# Patient Record
Sex: Male | Born: 1955 | Race: White | Hispanic: No | Marital: Married | Smoking: Former smoker
Health system: Southern US, Community
[De-identification: ages and names within clinical notes are randomized; demographics above are authoritative.]

## PROBLEM LIST (undated history)

## (undated) HISTORY — PX: OTHER SURGICAL HISTORY: SHX169

---

## 2008-03-22 ENCOUNTER — Encounter: Admission: RE | Admit: 2008-03-22 | Discharge: 2008-03-22 | Payer: Self-pay | Admitting: Neurosurgery

## 2008-04-09 ENCOUNTER — Encounter: Admission: RE | Admit: 2008-04-09 | Discharge: 2008-04-09 | Payer: Self-pay | Admitting: Neurosurgery

## 2020-06-12 ENCOUNTER — Encounter: Payer: Self-pay | Admitting: Internal Medicine

## 2020-07-12 ENCOUNTER — Other Ambulatory Visit: Payer: Self-pay

## 2020-07-12 ENCOUNTER — Ambulatory Visit (AMBULATORY_SURGERY_CENTER): Payer: Self-pay | Admitting: *Deleted

## 2020-07-12 VITALS — Ht 66.0 in | Wt 168.0 lb

## 2020-07-12 DIAGNOSIS — Z1211 Encounter for screening for malignant neoplasm of colon: Secondary | ICD-10-CM

## 2020-07-12 MED ORDER — PLENVU 140 G PO SOLR: 1.0000 | Freq: Once | ORAL | 0 refills | Status: AC

## 2020-07-12 NOTE — Progress Notes (Signed)

## 2020-07-15 ENCOUNTER — Encounter: Payer: Self-pay | Admitting: Internal Medicine

## 2020-07-16 ENCOUNTER — Other Ambulatory Visit: Payer: Self-pay

## 2020-07-16 ENCOUNTER — Encounter: Payer: Self-pay | Admitting: Internal Medicine

## 2020-07-16 ENCOUNTER — Ambulatory Visit (AMBULATORY_SURGERY_CENTER): Payer: BC Managed Care – PPO | Admitting: Internal Medicine

## 2020-07-16 VITALS — BP 116/80 | HR 99 | Temp 96.8°F | Resp 20 | Ht 66.0 in | Wt 168.0 lb

## 2020-07-16 DIAGNOSIS — D124 Benign neoplasm of descending colon: Secondary | ICD-10-CM | POA: Diagnosis not present

## 2020-07-16 DIAGNOSIS — Z1211 Encounter for screening for malignant neoplasm of colon: Secondary | ICD-10-CM

## 2020-07-16 DIAGNOSIS — D123 Benign neoplasm of transverse colon: Secondary | ICD-10-CM | POA: Diagnosis not present

## 2020-07-16 MED ORDER — SODIUM CHLORIDE 0.9 % IV SOLN: 500.0000 mL | Freq: Once | INTRAVENOUS | Status: DC

## 2020-07-16 NOTE — Progress Notes (Signed)
PT taken to PACU. Monitors in place. VSS. Report given to RN. 

## 2020-07-16 NOTE — Patient Instructions (Signed)
HANDOUTS PROVIDED ON: POLYPS, DIVERTICULOSIS, & HEMORRHOIDS  The polyps removed today have been sent for pathology.  The results can take 1-3 weeks to receive.  When your next colonoscopy should occur will be based on the pathology results.    You may resume your previous diet and medication schedule.  Thank you for allowing [place] to care for you today!!!   YOU HAD AN ENDOSCOPIC PROCEDURE TODAY AT [facility]:   Refer to the procedure report that was given to you for any specific questions about what was found during the examination.  If the procedure report does not answer your questions, please call your gastroenterologist to clarify.  If you requested that your care partner not be given the details of your procedure findings, then the procedure report has been included in a sealed envelope for you to review at your convenience later.  YOU SHOULD EXPECT: Some feelings of bloating in the abdomen. Passage of more gas than usual.  Walking can help get rid of the air that was put into your GI tract during the procedure and reduce the bloating. If you had a lower endoscopy (such as a colonoscopy or flexible sigmoidoscopy) you may notice spotting of blood in your stool or on the toilet paper. If you underwent a bowel prep for your procedure, you may not have a normal bowel movement for a few days.  Please Note:  You might notice some irritation and congestion in your nose or some drainage.  This is from the oxygen used during your procedure.  There is no need for concern and it should clear up in a day or so.  SYMPTOMS TO REPORT IMMEDIATELY:  Following lower endoscopy (colonoscopy or flexible sigmoidoscopy):  Excessive amounts of blood in the stool  Significant tenderness or worsening of abdominal pains  Swelling of the abdomen that is new, acute  Fever of 100F or higher   For urgent or emergent issues, a gastroenterologist can be reached at any hour by calling [phone]. Do  not use MyChart messaging for urgent concerns.    DIET:  We do recommend a small meal at first, but then you may proceed to your regular diet.  Drink plenty of fluids but you should avoid alcoholic beverages for 24 hours.  ACTIVITY:  You should plan to take it easy for the rest of today and you should NOT DRIVE or use heavy machinery until tomorrow (because of the sedation medicines used during the test).    FOLLOW UP: Our staff will call the number listed on your records Thursday morning between 7:15 am and 8:15 am to check on you and address any questions or concerns that you may have regarding the information given to you following your procedure. If we do not reach you, we will leave a message.  We will attempt to reach you two times.  During this call, we will ask if you have developed any symptoms of COVID 19. If you develop any symptoms (ie: fever, flu-like symptoms, shortness of breath, cough etc.) before then, please call [phone].  If you test positive for Covid 19 in the 2 weeks post procedure, please call and report this information to [place].    If any biopsies were taken you will be contacted by phone or by letter within the next 1-3 weeks.  Please call us at [phone] if you have not heard about the biopsies in 3 weeks.    SIGNATURES/CONFIDENTIALITY: You and/or your care partner have signed  paperwork which will be entered into your electronic medical record.  These signatures attest to the fact that that the information above on your After Visit Summary has been reviewed and is understood.  Full responsibility of the confidentiality of this discharge information lies with you and/or your care-partner.

## 2020-07-16 NOTE — Op Note (Signed)
[name] Patient Name: Eugene Carter Procedure Date: 07/16/2020 10:24 AM MRN: [id number] Endoscopist: Docia Chuck. Henrene Pastor , MD Age: 64 Referring MD:  Date of Birth: Jun 23, 1956 Gender: Male Account #: [id number] Procedure:                Colonoscopy with cold snare polypectomy x 2 Indications:              Screening for colorectal malignant neoplasm.                            Reports colonoscopy in [place] about 12 years ago Medicines:                Monitored Anesthesia Care Procedure:                Pre-Anesthesia Assessment:                           - Prior to the procedure, a History and Physical                            was performed, and patient medications and                            allergies were reviewed. The patient's tolerance of                            previous anesthesia was also reviewed. The risks                            and benefits of the procedure and the sedation                            options and risks were discussed with the patient.                            All questions were answered, and informed consent                            was obtained. Prior Anticoagulants: The patient has                            taken no previous anticoagulant or antiplatelet                            agents. ASA Grade Assessment: II - A patient with                            mild systemic disease. After reviewing the risks                            and benefits, the patient was deemed in                            satisfactory condition to undergo the procedure.  After obtaining informed consent, the colonoscope                            was passed under direct vision. Throughout the                            procedure, the patient's blood pressure, pulse, and                            oxygen saturations were monitored continuously. The                            Colonoscope was introduced through the anus and                             advanced to the the cecum, identified by                            appendiceal orifice and ileocecal valve. The                            ileocecal valve, appendiceal orifice, and rectum                            were photographed. The quality of the bowel                            preparation was excellent. The colonoscopy was                            performed without difficulty. The patient tolerated                            the procedure well. The bowel preparation used was                            SUPREP via split dose instruction. Scope In: 10:41:59 AM Scope Out: 10:54:09 AM Scope Withdrawal Time: 0 hours 9 minutes 57 seconds  Total Procedure Duration: 0 hours 12 minutes 10 seconds  Findings:                 Two polyps were found in the descending colon and                            transverse colon. The polyps were 1 to 2 mm in                            size. These polyps were removed with a cold snare.                            Resection and retrieval were complete.                           Multiple diverticula were found in the  sigmoid                            colon.                           Internal hemorrhoids were found during retroflexion.                           The exam was otherwise without abnormality on                            direct and retroflexion views. Complications:            No immediate complications. Estimated blood loss:                            None. Estimated Blood Loss:     Estimated blood loss: none. Impression:               - Two 1 to 2 mm polyps in the descending colon and                            in the transverse colon, removed with a cold snare.                            Resected and retrieved.                           - Diverticulosis in the sigmoid colon.                           - Internal hemorrhoids.                           - The examination was otherwise normal on direct                             and retroflexion views. Recommendation:           - Repeat colonoscopy in 7-10 years for surveillance.                           - Patient has a contact number available for                            emergencies. The signs and symptoms of potential                            delayed complications were discussed with the                            patient. Return to normal activities tomorrow.                            Written discharge instructions were provided to the  patient.                           - Resume previous diet.                           - Continue present medications.                           - Await pathology results. Docia Chuck. Henrene Pastor, MD 07/16/2020 11:02:00 AM This report has been signed electronically.

## 2020-07-16 NOTE — Progress Notes (Signed)
Pt's states no medical or surgical changes since previsit or office visit. VS by CW. 

## 2020-07-18 ENCOUNTER — Telehealth: Payer: Self-pay

## 2020-07-18 ENCOUNTER — Telehealth: Payer: Self-pay | Admitting: *Deleted

## 2020-07-18 NOTE — Telephone Encounter (Signed)
  Follow up Call-  Call back number 07/16/2020  Post procedure Call Back phone  # [phone]  Permission to leave phone message Yes  Some recent data might be hidden     Patient questions:  Do you have a fever, pain , or abdominal swelling? No. Pain Score  0 *  Have you tolerated food without any problems? Yes.    Have you been able to return to your normal activities? Yes.    Do you have any questions about your discharge instructions: Diet   No. Medications  No. Follow up visit  No.  Do you have questions or concerns about your Care? No.  Actions: * If pain score is 4 or above: 1. No action needed, pain <4.Have you developed a fever since your procedure? no  2.   Have you had an respiratory symptoms (SOB or cough) since your procedure? no  3.   Have you tested positive for COVID 19 since your procedure no  4.   Have you had any family members/close contacts diagnosed with the COVID 19 since your procedure?  no   If yes to any of these questions please route to Joylene John, RN and Joella Prince, RN

## 2020-07-18 NOTE — Telephone Encounter (Signed)
Left message on f/u call 

## 2020-07-22 ENCOUNTER — Encounter: Payer: Self-pay | Admitting: Internal Medicine

## 2020-08-01 ENCOUNTER — Encounter: Payer: Self-pay | Admitting: Internal Medicine

## 2024-06-27 ENCOUNTER — Ambulatory Visit (HOSPITAL_BASED_OUTPATIENT_CLINIC_OR_DEPARTMENT_OTHER)
Admission: EM | Admit: 2024-06-27 | Discharge: 2024-06-27 | Disposition: A | Discharge status: Home / Self Care | Attending: Family Medicine | Admitting: Family Medicine

## 2024-06-27 ENCOUNTER — Encounter (HOSPITAL_BASED_OUTPATIENT_CLINIC_OR_DEPARTMENT_OTHER): Payer: Self-pay

## 2024-06-27 DIAGNOSIS — M7989 Other specified soft tissue disorders: Secondary | ICD-10-CM

## 2024-06-27 NOTE — ED Provider Notes (Signed)
 [facility]    CSN: [id number] Arrival date & time: 06/27/24  1019      History   Chief Complaint Chief Complaint  Patient presents with   Joint Swelling    HPI Yahir Tavano is a 68 y.o. male.   Pt  is a 68 year old male that presents with  left elbow and left hand swelling. He has been having the left elbow soreness and swelling for the last week. He just noticed the hand swelling today.This was after wearing a sleeve on the arm.  Denies known injury. He has not taken anything for the pain.       History reviewed. No pertinent past medical history.  There are no active problems to display for this patient.   Past Surgical History:  Procedure Laterality Date   skin grafting         Home Medications    Prior to Admission medications   Medication Sig Start Date End Date Taking? Authorizing Provider  celecoxib (CELEBREX) 200 MG capsule Take 200 mg by mouth. 07/30/22  Yes [provider]  gabapentin (NEURONTIN) 300 MG capsule Take 300 mg by mouth. 07/30/22  Yes [provider]  rosuvastatin (CRESTOR) 10 MG tablet Take 10 mg by mouth daily. 07/31/22  Yes [provider]  aspirin 81 MG chewable tablet Chew 81 mg by mouth.    [provider]    Family History Family History  Problem Relation Age of Onset   Colon polyps Neg Hx    Colon cancer Neg Hx    Esophageal cancer Neg Hx    Stomach cancer Neg Hx    Rectal cancer Neg Hx     Social History Social History   Tobacco Use   Smoking status: Former    Current packs/day: 1.00    Average packs/day: 1 pack/day for 45.9 years (45.9 ttl pk-yrs)    Types: Cigarettes    Start date: 1980   Smokeless tobacco: Never  Vaping Use   Vaping status: Never Used  Substance Use Topics   Alcohol use: Not Currently    Comment: 4 hard seltzers per day    Drug use: Never     Allergies   Patient has no known allergies.   Review of Systems Review of Systems  See  HPI Physical Exam Triage Vital Signs ED Triage Vitals  Encounter Vitals Group     BP 06/27/24 1039 (!) 149/73     Girls Systolic BP Percentile --      Girls Diastolic BP Percentile --      Boys Systolic BP Percentile --      Boys Diastolic BP Percentile --      Pulse Rate 06/27/24 1039 75     Resp 06/27/24 1039 20     Temp 06/27/24 1039 97.7 F (36.5 C)     Temp Source 06/27/24 1039 Oral     SpO2 06/27/24 1039 94 %     Weight --      Height --      Head Circumference --      Peak Flow --      Pain Score 06/27/24 1036 0     Pain Loc --      Pain Education --      Exclude from Growth Chart --    No data found.  Updated Vital Signs BP (!) 149/73 (BP Location: Right Arm)   Pulse 75   Temp 97.7 F (36.5 C) (Oral)  Resp 20   SpO2 94%   Visual Acuity Right Eye Distance:   Left Eye Distance:   Bilateral Distance:    Right Eye Near:   Left Eye Near:    Bilateral Near:     Physical Exam Constitutional:      Appearance: Normal appearance.  Pulmonary:     Effort: Pulmonary effort is normal.  Musculoskeletal:        General: Swelling present. Normal range of motion.     Comments: Generalized swelling to the left elbow and wrist. Non tender. No erythema.   Neurological:     Mental Status: He is alert.  Psychiatric:        Mood and Affect: Mood normal.      UC Treatments / Results  Labs (all labs ordered are listed, but only abnormal results are displayed) Labs Reviewed - No data to display  EKG   Radiology No results found.  Procedures Procedures (including critical care time)  Medications Ordered in UC Medications - No data to display  Initial Impression / Assessment and Plan / UC Course  I have reviewed the triage vital signs and the nursing notes.  Pertinent labs & imaging results that were available during my care of the patient were reviewed by me and considered in my medical decision making (see chart for details).     Arm swelling- No  concerns on exam  today.  I believe that the swelling is coming from the elbow joint and is probably from pressure applied to the area.  Recommend a compression sleeve to help with the swelling.  Elevate the arm.  Follow-up with your doctor for any continued issues Final Clinical Impressions(s) / UC Diagnoses   Final diagnoses:  Arm swelling     Discharge Instructions      No concerns today.  I believe that the swelling is coming from the elbow joint and is probably from pressure applied to the area.  Recommend a compression sleeves to help with the swelling.  Elevate the arm.  Follow-up with your doctor for any continued issues     ED Prescriptions   None    PDMP not reviewed this encounter.   Adah Wilbert LABOR, FNP 06/27/24 1336

## 2024-06-27 NOTE — ED Triage Notes (Signed)
 Pt c/o left elbow and left hand swelling. He has been having the left elbow soreness and swelling for the last week. He just noticed the hand swelling today. Denies known injury. He has not taken anything for the pain.

## 2024-06-27 NOTE — Discharge Instructions (Addendum)
 No concerns today.  I believe that the swelling is coming from the elbow joint and is probably from pressure applied to the area.  Recommend a compression sleeves to help with the swelling.  Elevate the arm.  Follow-up with your doctor for any continued issues
